# Patient Record
Sex: Female | Born: 1985 | Race: Black or African American | Hispanic: Yes | Marital: Single | Smoking: Current every day smoker
Health system: Southern US, Community
[De-identification: ages and names within clinical notes are randomized; demographics above are authoritative.]

## PROBLEM LIST (undated history)

## (undated) DIAGNOSIS — N809 Endometriosis, unspecified: Secondary | ICD-10-CM

## (undated) HISTORY — PX: OTHER SURGICAL HISTORY: SHX169

---

## 2007-09-04 ENCOUNTER — Emergency Department (HOSPITAL_COMMUNITY): Admission: EM | Admit: 2007-09-04 | Discharge: 2007-09-04 | Payer: Self-pay | Admitting: Emergency Medicine

## 2007-09-05 ENCOUNTER — Emergency Department (HOSPITAL_COMMUNITY): Admission: EM | Admit: 2007-09-05 | Discharge: 2007-09-05 | Payer: Self-pay | Admitting: Emergency Medicine

## 2009-09-01 ENCOUNTER — Emergency Department (HOSPITAL_COMMUNITY): Admission: EM | Admit: 2009-09-01 | Discharge: 2009-09-01 | Payer: Self-pay | Admitting: Emergency Medicine

## 2010-02-09 ENCOUNTER — Emergency Department (HOSPITAL_COMMUNITY): Admission: EM | Admit: 2010-02-09 | Discharge: 2010-02-09 | Payer: Self-pay | Admitting: Emergency Medicine

## 2010-07-28 LAB — WET PREP, GENITAL: Yeast Wet Prep HPF POC: NONE SEEN

## 2010-07-28 LAB — POCT URINALYSIS DIPSTICK
Ketones, ur: 15 mg/dL — AB
Specific Gravity, Urine: 1.025 (ref 1.005–1.030)
pH: 6 (ref 5.0–8.0)

## 2010-07-28 LAB — GC/CHLAMYDIA PROBE AMP, GENITAL: GC Probe Amp, Genital: NEGATIVE

## 2011-02-07 LAB — GC/CHLAMYDIA PROBE AMP, GENITAL: GC Probe Amp, Genital: NEGATIVE

## 2011-02-07 LAB — PREGNANCY, URINE: Preg Test, Ur: NEGATIVE

## 2011-02-07 LAB — URINALYSIS, ROUTINE W REFLEX MICROSCOPIC
Ketones, ur: NEGATIVE
Nitrite: NEGATIVE
Protein, ur: NEGATIVE
pH: 7.5

## 2011-02-07 LAB — RPR: RPR Ser Ql: NONREACTIVE

## 2011-02-07 LAB — URINE MICROSCOPIC-ADD ON

## 2011-02-07 LAB — WET PREP, GENITAL: WBC, Wet Prep HPF POC: NONE SEEN

## 2014-08-14 ENCOUNTER — Emergency Department (HOSPITAL_COMMUNITY)
Admission: EM | Admit: 2014-08-14 | Discharge: 2014-08-14 | Disposition: A | Discharge status: Home / Self Care | Payer: BLUE CROSS/BLUE SHIELD | Attending: Emergency Medicine | Admitting: Emergency Medicine

## 2014-08-14 ENCOUNTER — Encounter (HOSPITAL_COMMUNITY): Payer: Self-pay

## 2014-08-14 ENCOUNTER — Emergency Department (HOSPITAL_COMMUNITY): Payer: BLUE CROSS/BLUE SHIELD

## 2014-08-14 DIAGNOSIS — R102 Pelvic and perineal pain: Secondary | ICD-10-CM

## 2014-08-14 DIAGNOSIS — Z3202 Encounter for pregnancy test, result negative: Secondary | ICD-10-CM | POA: Insufficient documentation

## 2014-08-14 DIAGNOSIS — N83201 Unspecified ovarian cyst, right side: Secondary | ICD-10-CM

## 2014-08-14 DIAGNOSIS — Z720 Tobacco use: Secondary | ICD-10-CM | POA: Diagnosis not present

## 2014-08-14 DIAGNOSIS — N8320 Unspecified ovarian cysts: Secondary | ICD-10-CM | POA: Diagnosis not present

## 2014-08-14 DIAGNOSIS — R1030 Lower abdominal pain, unspecified: Secondary | ICD-10-CM | POA: Diagnosis present

## 2014-08-14 LAB — CBC WITH DIFFERENTIAL/PLATELET
BASOS PCT: 0 % (ref 0–1)
Basophils Absolute: 0.1 10*3/uL (ref 0.0–0.1)
Eosinophils Absolute: 0.2 10*3/uL (ref 0.0–0.7)
Eosinophils Relative: 2 % (ref 0–5)
HCT: 41.1 % (ref 36.0–46.0)
HEMOGLOBIN: 13.6 g/dL (ref 12.0–15.0)
Lymphocytes Relative: 19 % (ref 12–46)
Lymphs Abs: 2.6 10*3/uL (ref 0.7–4.0)
MCH: 31.9 pg (ref 26.0–34.0)
MCHC: 33.1 g/dL (ref 30.0–36.0)
MCV: 96.5 fL (ref 78.0–100.0)
MONOS PCT: 6 % (ref 3–12)
Monocytes Absolute: 0.9 10*3/uL (ref 0.1–1.0)
NEUTROS ABS: 9.9 10*3/uL — AB (ref 1.7–7.7)
NEUTROS PCT: 73 % (ref 43–77)
Platelets: 283 10*3/uL (ref 150–400)
RBC: 4.26 MIL/uL (ref 3.87–5.11)
RDW: 13.4 % (ref 11.5–15.5)
WBC: 13.6 10*3/uL — AB (ref 4.0–10.5)

## 2014-08-14 LAB — COMPREHENSIVE METABOLIC PANEL
ALT: 13 U/L (ref 0–35)
AST: 21 U/L (ref 0–37)
Albumin: 3.7 g/dL (ref 3.5–5.2)
Alkaline Phosphatase: 78 U/L (ref 39–117)
Anion gap: 6 (ref 5–15)
BILIRUBIN TOTAL: 0.9 mg/dL (ref 0.3–1.2)
BUN: 9 mg/dL (ref 6–23)
CALCIUM: 9 mg/dL (ref 8.4–10.5)
CHLORIDE: 107 mmol/L (ref 96–112)
CO2: 24 mmol/L (ref 19–32)
Creatinine, Ser: 0.69 mg/dL (ref 0.50–1.10)
GFR calc non Af Amer: >90 mL/min (ref >90)
GLUCOSE: 83 mg/dL (ref 70–99)
Potassium: 4.9 mmol/L (ref 3.5–5.1)
SODIUM: 137 mmol/L (ref 135–145)
Total Protein: 6.6 g/dL (ref 6.0–8.3)

## 2014-08-14 LAB — URINALYSIS, ROUTINE W REFLEX MICROSCOPIC
Bilirubin Urine: NEGATIVE
Glucose, UA: NEGATIVE mg/dL
Hgb urine dipstick: NEGATIVE
KETONES UR: NEGATIVE mg/dL
LEUKOCYTES UA: NEGATIVE
NITRITE: NEGATIVE
PROTEIN: NEGATIVE mg/dL
Specific Gravity, Urine: 1.013 (ref 1.005–1.030)
UROBILINOGEN UA: 0.2 mg/dL (ref 0.0–1.0)
pH: 6 (ref 5.0–8.0)

## 2014-08-14 LAB — POC URINE PREG, ED: PREG TEST UR: NEGATIVE

## 2014-08-14 LAB — WET PREP, GENITAL
CLUE CELLS WET PREP: NONE SEEN
TRICH WET PREP: NONE SEEN
YEAST WET PREP: NONE SEEN

## 2014-08-14 MED ORDER — OXYCODONE-ACETAMINOPHEN 5-325 MG PO TABS: 1.0000 | ORAL_TABLET | Freq: Four times a day (QID) | ORAL | Status: DC | PRN

## 2014-08-14 MED ORDER — MORPHINE SULFATE 4 MG/ML IJ SOLN
4.0000 mg | Freq: Once | INTRAMUSCULAR | Status: AC
  Administered 2014-08-14: 4 mg via INTRAVENOUS
  Filled 2014-08-14: qty 1

## 2014-08-14 MED ORDER — ONDANSETRON HCL 4 MG/2ML IJ SOLN
4.0000 mg | Freq: Once | INTRAMUSCULAR | Status: AC
  Administered 2014-08-14: 4 mg via INTRAVENOUS
  Filled 2014-08-14: qty 2

## 2014-08-14 NOTE — ED Provider Notes (Signed)
CSN: [id number]     Arrival date & time 08/14/14  4098 History   First MD Initiated Contact with Patient 08/14/14 [phone]     Chief Complaint  Patient presents with  . Abdominal Pain     (Consider location/radiation/quality/duration/timing/severity/associated sxs/prior Treatment) HPI Comments: Patient presents today with a chief complaint of abdominal pain.  She reports that the pain is located across her lower abdomen and radiates to the flank area bilaterally.  She reports that the pain woke her up from sleep at 6:50 AM this morning.  Pain has been constant since that time, but is improving somewhat.  She states that the pain is worse when she sits up straight.  She states that she had a BM this morning, but thinks that this made the pain worse.  She reports increased urinary frequency, but denies dysuria or urgency.  She reports that she has had whitish colored vaginal discharge for the past couple of months.  She denies fever, chills, nausea, vomiting, or diarrhea.  LMP was around 06-28-14.    The history is provided by the patient.    History reviewed. No pertinent past medical history. History reviewed. No pertinent past surgical history. No family history on file. History  Substance Use Topics  . Smoking status: Current Every Day Smoker    Types: Cigarettes  . Smokeless tobacco: Not on file  . Alcohol Use: Yes     Comment: occassional   OB History    No data available     Review of Systems  All other systems reviewed and are negative.     Allergies  Review of patient's allergies indicates no known allergies.  Home Medications   Prior to Admission medications   Not on File   BP 116/71 mmHg  Pulse 101  Temp(Src) 98.9 F (37.2 C) (Oral)  Resp 18  Ht  (1.702 m)  Wt 200 lb (90.719 kg)  BMI 31.32 kg/m2  SpO2 96%  LMP 06/28/2014 Physical Exam  Constitutional: She appears well-developed and well-nourished.  HENT:  Head: Normocephalic and atraumatic.   Mouth/Throat: Oropharynx is clear and moist.  Neck: Normal range of motion. Neck supple.  Cardiovascular: Normal rate, regular rhythm and normal heart sounds.   Pulmonary/Chest: Effort normal and breath sounds normal.  Abdominal: Soft. Bowel sounds are normal. She exhibits no distension and no mass. There is tenderness in the right lower quadrant, suprapubic area and left lower quadrant. There is no rigidity, no rebound and no guarding.  Tenderness to palpation across lower abdomen  Genitourinary: Cervix exhibits no motion tenderness. Right adnexum displays tenderness. Right adnexum displays no mass and no fullness. Left adnexum displays tenderness. Left adnexum displays no mass and no fullness.  Musculoskeletal: Normal range of motion.  Neurological: She is alert.  Skin: Skin is warm and dry.  Psychiatric: She has a normal mood and affect.  Nursing note and vitals reviewed.   ED Course  Procedures (including critical care time) Labs Review Labs Reviewed  WET PREP, GENITAL - Abnormal; Notable for the following:    WBC, Wet Prep HPF POC FEW (*)    All other components within normal limits  CBC WITH DIFFERENTIAL/PLATELET - Abnormal; Notable for the following:    WBC 13.6 (*)    Neutro Abs 9.9 (*)    All other components within normal limits  URINALYSIS, ROUTINE W REFLEX MICROSCOPIC  COMPREHENSIVE METABOLIC PANEL  POC URINE PREG, ED  GC/CHLAMYDIA PROBE AMP ([place])    Imaging Review US  Transvaginal Non-ob  08/14/2014   CLINICAL DATA:  Pelvic pain  EXAM: TRANSABDOMINAL AND TRANSVAGINAL ULTRASOUND OF PELVIS  DOPPLER ULTRASOUND OF OVARIES  TECHNIQUE: Both transabdominal and transvaginal ultrasound examinations of the pelvis were performed. Transabdominal technique was performed for global imaging of the pelvis including uterus, ovaries, adnexal regions, and pelvic cul-de-sac.  It was necessary to proceed with endovaginal exam following the transabdominal exam to visualize the  ovaries. Color and duplex Doppler ultrasound was utilized to evaluate blood flow to the ovaries.  COMPARISON:  None.  FINDINGS: Uterus  Measurements: 7.0 x 3.8 x 3.8 cm. A fibroid is noted within the uterine fundus measuring 1.6 cm.  Endometrium  Thickness: 8 mm.  No focal abnormality visualized.  Right ovary  Measurements: 5.6 x 3.4 x 3.7 cm. Multiple cysts are noted within the right ovary. The largest of these measures 3.1 cm in greatest dimension. Some slight increased echogenicity is noted within the cyst which may represent some hemorrhage. Normal arterial venous blood flow is noted within the right ovary.  Left ovary  Measurements: 4.4 x 3.1 x 2.5 cm. Normal appearance/no adnexal mass.  Pulsed Doppler evaluation of both ovaries demonstrates normal low-resistance arterial and venous waveforms.  Other findings  Minimal free fluid is noted.  IMPRESSION: No evidence of ovarian torsion  Ovarian cystic changes on the right. A dominant 3.1 cm cyst is noted with some internal echoes likely representing some hemorrhage.  Minimal free pelvic fluid, this may be physiologic.   Electronically Signed   By: Alcide Clever M.D.   On: 08/14/2014 13:07   US Pelvis Complete  08/14/2014   CLINICAL DATA:  Pelvic pain  EXAM: TRANSABDOMINAL AND TRANSVAGINAL ULTRASOUND OF PELVIS  DOPPLER ULTRASOUND OF OVARIES  TECHNIQUE: Both transabdominal and transvaginal ultrasound examinations of the pelvis were performed. Transabdominal technique was performed for global imaging of the pelvis including uterus, ovaries, adnexal regions, and pelvic cul-de-sac.  It was necessary to proceed with endovaginal exam following the transabdominal exam to visualize the ovaries. Color and duplex Doppler ultrasound was utilized to evaluate blood flow to the ovaries.  COMPARISON:  None.  FINDINGS: Uterus  Measurements: 7.0 x 3.8 x 3.8 cm. A fibroid is noted within the uterine fundus measuring 1.6 cm.  Endometrium  Thickness: 8 mm.  No focal abnormality  visualized.  Right ovary  Measurements: 5.6 x 3.4 x 3.7 cm. Multiple cysts are noted within the right ovary. The largest of these measures 3.1 cm in greatest dimension. Some slight increased echogenicity is noted within the cyst which may represent some hemorrhage. Normal arterial venous blood flow is noted within the right ovary.  Left ovary  Measurements: 4.4 x 3.1 x 2.5 cm. Normal appearance/no adnexal mass.  Pulsed Doppler evaluation of both ovaries demonstrates normal low-resistance arterial and venous waveforms.  Other findings  Minimal free fluid is noted.  IMPRESSION: No evidence of ovarian torsion  Ovarian cystic changes on the right. A dominant 3.1 cm cyst is noted with some internal echoes likely representing some hemorrhage.  Minimal free pelvic fluid, this may be physiologic.   Electronically Signed   By: Alcide Clever M.D.   On: 08/14/2014 13:07   [place] Art/ven Flow Abd Pelv Doppler  08/14/2014   CLINICAL DATA:  Pelvic pain  EXAM: TRANSABDOMINAL AND TRANSVAGINAL ULTRASOUND OF PELVIS  DOPPLER ULTRASOUND OF OVARIES  TECHNIQUE: Both transabdominal and transvaginal ultrasound examinations of the pelvis were performed. Transabdominal technique was performed for global imaging of the pelvis including uterus, ovaries, adnexal  regions, and pelvic cul-de-sac.  It was necessary to proceed with endovaginal exam following the transabdominal exam to visualize the ovaries. Color and duplex Doppler ultrasound was utilized to evaluate blood flow to the ovaries.  COMPARISON:  None.  FINDINGS: Uterus  Measurements: 7.0 x 3.8 x 3.8 cm. A fibroid is noted within the uterine fundus measuring 1.6 cm.  Endometrium  Thickness: 8 mm.  No focal abnormality visualized.  Right ovary  Measurements: 5.6 x 3.4 x 3.7 cm. Multiple cysts are noted within the right ovary. The largest of these measures 3.1 cm in greatest dimension. Some slight increased echogenicity is noted within the cyst which may represent some hemorrhage. Normal  arterial venous blood flow is noted within the right ovary.  Left ovary  Measurements: 4.4 x 3.1 x 2.5 cm. Normal appearance/no adnexal mass.  Pulsed Doppler evaluation of both ovaries demonstrates normal low-resistance arterial and venous waveforms.  Other findings  Minimal free fluid is noted.  IMPRESSION: No evidence of ovarian torsion  Ovarian cystic changes on the right. A dominant 3.1 cm cyst is noted with some internal echoes likely representing some hemorrhage.  Minimal free pelvic fluid, this may be physiologic.   Electronically Signed   By: Alcide CleverMark  Lukens M.D.   On: 08/14/2014 13:07     EKG Interpretation None     1:30 PM Reassessed patient.  Abdomen is soft with mild tenderness to palpation across lower abdomen.  No rebound or guarding.   MDM   Final diagnoses:  None   Patient presents today with complaints of pain across her lower abdomen.  Urine pregnancy negative.  UA also negative.  She did have some adnexal tenderness bilaterally with pelvic exam, but no abnormal discharge or CMT.  Pelvic ultrasound showing no evidence of torsion, but ovarian cystic changes on the right. No rebound or guarding on abdominal exam.  Patient afebrile.  Pain is controlled at time of discharge.  Feel that the patient is stable for discharge.  Patient instructed to follow up with Dr. Arlyce DiceKaplan her OB/GYN.  Return precautions given.    Santiago GladHeather Valissa Lyvers, PA-C 08/16/14 [id number]  Cathren LaineKevin Steinl, MD 08/17/14 [phone]

## 2014-08-14 NOTE — ED Notes (Signed)
Patient transported to Ultrasound 

## 2014-08-14 NOTE — ED Notes (Addendum)
Woke up with lower abdominal pain into her bilateral flank area.  Pt. Had difficulty moving bowels this am.  Pt. Denies any dsyuria.  Pt. Having vaginal discharge, with an odor.  Pt. Denies any n/v/d

## 2014-08-17 LAB — GC/CHLAMYDIA PROBE AMP (~~LOC~~) NOT AT ARMC
Chlamydia: NEGATIVE
NEISSERIA GONORRHEA: NEGATIVE

## 2018-11-06 ENCOUNTER — Other Ambulatory Visit: Payer: Self-pay

## 2018-11-06 ENCOUNTER — Emergency Department (HOSPITAL_COMMUNITY)
Admission: EM | Admit: 2018-11-06 | Discharge: 2018-11-06 | Disposition: A | Discharge status: Home / Self Care | Payer: BLUE CROSS/BLUE SHIELD | Attending: Emergency Medicine | Admitting: Emergency Medicine

## 2018-11-06 ENCOUNTER — Encounter (HOSPITAL_COMMUNITY): Payer: Self-pay

## 2018-11-06 DIAGNOSIS — F17210 Nicotine dependence, cigarettes, uncomplicated: Secondary | ICD-10-CM | POA: Insufficient documentation

## 2018-11-06 DIAGNOSIS — R112 Nausea with vomiting, unspecified: Secondary | ICD-10-CM | POA: Insufficient documentation

## 2018-11-06 DIAGNOSIS — N939 Abnormal uterine and vaginal bleeding, unspecified: Secondary | ICD-10-CM | POA: Insufficient documentation

## 2018-11-06 DIAGNOSIS — R197 Diarrhea, unspecified: Secondary | ICD-10-CM | POA: Insufficient documentation

## 2018-11-06 DIAGNOSIS — R252 Cramp and spasm: Secondary | ICD-10-CM | POA: Insufficient documentation

## 2018-11-06 HISTORY — DX: Endometriosis, unspecified: N80.9

## 2018-11-06 LAB — I-STAT BETA HCG BLOOD, ED (MC, WL, AP ONLY): I-stat hCG, quantitative: <5 m[IU]/mL (ref <5)

## 2018-11-06 LAB — COMPREHENSIVE METABOLIC PANEL
ALT: 15 U/L (ref 0–44)
AST: 14 U/L — ABNORMAL LOW (ref 15–41)
Albumin: 4.4 g/dL (ref 3.5–5.0)
Alkaline Phosphatase: 83 U/L (ref 38–126)
Anion gap: 8 (ref 5–15)
BUN: 6 mg/dL (ref 6–20)
CO2: 26 mmol/L (ref 22–32)
Calcium: 8.9 mg/dL (ref 8.9–10.3)
Chloride: 104 mmol/L (ref 98–111)
Creatinine, Ser: 0.67 mg/dL (ref 0.44–1.00)
GFR calc Af Amer: >60 mL/min (ref >60)
GFR calc non Af Amer: >60 mL/min (ref >60)
Glucose, Bld: 91 mg/dL (ref 70–99)
Potassium: 3.5 mmol/L (ref 3.5–5.1)
Sodium: 138 mmol/L (ref 135–145)
Total Bilirubin: 0.5 mg/dL (ref 0.3–1.2)
Total Protein: 7.5 g/dL (ref 6.5–8.1)

## 2018-11-06 LAB — URINALYSIS, ROUTINE W REFLEX MICROSCOPIC
Bilirubin Urine: NEGATIVE
Glucose, UA: NEGATIVE mg/dL
Hgb urine dipstick: NEGATIVE
Ketones, ur: NEGATIVE mg/dL
Leukocytes,Ua: NEGATIVE
Nitrite: NEGATIVE
Protein, ur: NEGATIVE mg/dL
Specific Gravity, Urine: 1.008 (ref 1.005–1.030)
pH: 7 (ref 5.0–8.0)

## 2018-11-06 LAB — CBC
HCT: 42.9 % (ref 36.0–46.0)
Hemoglobin: 13.5 g/dL (ref 12.0–15.0)
MCH: 31 pg (ref 26.0–34.0)
MCHC: 31.5 g/dL (ref 30.0–36.0)
MCV: 98.6 fL (ref 80.0–100.0)
Platelets: 302 10*3/uL (ref 150–400)
RBC: 4.35 MIL/uL (ref 3.87–5.11)
RDW: 12.8 % (ref 11.5–15.5)
WBC: 11.9 10*3/uL — ABNORMAL HIGH (ref 4.0–10.5)
nRBC: 0 % (ref 0.0–0.2)

## 2018-11-06 LAB — LIPASE, BLOOD: Lipase: 38 U/L (ref 11–51)

## 2018-11-06 MED ORDER — SODIUM CHLORIDE 0.9% FLUSH
3.0000 mL | Freq: Once | INTRAVENOUS | Status: AC
  Administered 2018-11-06: 3 mL via INTRAVENOUS

## 2018-11-06 MED ORDER — ONDANSETRON HCL 8 MG PO TABS: 8.0000 mg | ORAL_TABLET | Freq: Three times a day (TID) | ORAL | 0 refills | Status: DC | PRN

## 2018-11-06 MED ORDER — ONDANSETRON 4 MG PO TBDP
4.0000 mg | ORAL_TABLET | Freq: Once | ORAL | Status: AC | PRN
  Administered 2018-11-06: 4 mg via ORAL
  Filled 2018-11-06: qty 1

## 2018-11-06 MED ORDER — SODIUM CHLORIDE 0.9 % IV BOLUS
1000.0000 mL | Freq: Once | INTRAVENOUS | Status: AC
  Administered 2018-11-06: 1000 mL via INTRAVENOUS

## 2018-11-06 NOTE — ED Provider Notes (Signed)
[facility]-EMERGENCY DEPT Provider Note   CSN: [id number] Arrival date & time: 11/06/18  1225    History   Chief Complaint Chief Complaint  Patient presents with  . Abdominal Pain  . Emesis    HPI Christie Gonzalez is a 33 y.o. female.     HPI   She presents for evaluation of diffuse abdominal pain with vomiting and diarrhea, present for 2 weeks.  She started her menstrual cycle yesterday.  She is complaining of pelvic cramps consistent with her usual menses.  She denied blood in emesis.  She denies fever, chills, shortness of breath, weakness or dizziness.  She has had occasional cough, but she relates to her smoking tobacco.  No known sick contacts.  She currently employed, helping people find jobs.  There are no other known modifying factors.  Past Medical History:  Diagnosis Date  . Endometriosis     There are no active problems to display for this patient.   Past Surgical History:  Procedure Laterality Date  . vaginal ablation       OB History   No obstetric history on file.      Home Medications    Prior to Admission medications   Medication Sig Start Date End Date Taking? Authorizing Provider  traMADol (ULTRAM) 50 MG tablet Take 50-100 mg by mouth 4 (four) times daily as needed for pain. 10/21/18 11/20/18 Yes [provider]  ondansetron (ZOFRAN) 8 MG tablet Take 1 tablet (8 mg total) by mouth every 8 (eight) hours as needed for nausea or vomiting. 11/06/18   Mancel BaleWentz, Darnice Comrie, MD  oxyCODONE-acetaminophen (PERCOCET/ROXICET) 5-325 MG per tablet Take 1-2 tablets by mouth every 6 (six) hours as needed for severe pain. Patient not taking: Reported on 11/06/2018 08/14/14   Santiago GladLaisure, Heather, PA-C    Family History History reviewed. No pertinent family history.  Social History Social History   Tobacco Use  . Smoking status: Current Every Day Smoker    Packs/day: 0.50    Types: Cigarettes  . Smokeless tobacco: Never Used  Substance Use  Topics  . Alcohol use: Yes    Comment: occassional  . Drug use: No     Allergies   Patient has no known allergies.   Review of Systems Review of Systems  All other systems reviewed and are negative.    Physical Exam Updated Vital Signs BP (!) 149/67   Pulse 69   Temp 98.8 F (37.1 C) (Oral)   Resp 17   Ht 5\' 7"  (1.702 m)   Wt 99.8 kg   LMP 11/05/2018   SpO2 100%   BMI 34.46 kg/m   Physical Exam Vitals signs and nursing note reviewed.  Constitutional:      General: She is not in acute distress.    Appearance: She is well-developed. She is obese. She is not ill-appearing, toxic-appearing or diaphoretic.  HENT:     Head: Normocephalic and atraumatic.     Right Ear: External ear normal.     Left Ear: External ear normal.  Eyes:     Conjunctiva/sclera: Conjunctivae normal.     Pupils: Pupils are equal, round, and reactive to light.  Neck:     Musculoskeletal: Normal range of motion and neck supple.     Trachea: Phonation normal.  Cardiovascular:     Rate and Rhythm: Normal rate and regular rhythm.     Heart sounds: Normal heart sounds.  Pulmonary:     Effort: Pulmonary effort is normal.  Breath sounds: Normal breath sounds.  Abdominal:     General: There is no distension.     Palpations: Abdomen is soft. There is no mass.     Tenderness: There is abdominal tenderness (Diffuse, mild). There is no guarding.     Hernia: No hernia is present.  Musculoskeletal: Normal range of motion.  Skin:    General: Skin is warm and dry.  Neurological:     Mental Status: She is alert and oriented to person, place, and time.     Cranial Nerves: No cranial nerve deficit.     Sensory: No sensory deficit.     Motor: No abnormal muscle tone.     Coordination: Coordination normal.  Psychiatric:        Mood and Affect: Mood normal.        Behavior: Behavior normal.        Thought Content: Thought content normal.        Judgment: Judgment normal.      ED Treatments /  Results  Labs (all labs ordered are listed, but only abnormal results are displayed) Labs Reviewed  COMPREHENSIVE METABOLIC PANEL - Abnormal; Notable for the following components:      Result Value   AST 14 (*)    All other components within normal limits  CBC - Abnormal; Notable for the following components:   WBC 11.9 (*)    All other components within normal limits  LIPASE, BLOOD  URINALYSIS, ROUTINE W REFLEX MICROSCOPIC  I-STAT BETA HCG BLOOD, ED ([facility], [facility], [facility] ONLY)    EKG None  Radiology No results found.  Procedures Procedures (including critical care time)  Medications Ordered in ED Medications  sodium chloride flush (NS) 0.9 % injection 3 mL (3 mLs Intravenous Given 11/06/18 1650)  ondansetron (ZOFRAN-ODT) disintegrating tablet 4 mg (4 mg Oral Given 11/06/18 1302)  sodium chloride 0.9 % bolus 1,000 mL (1,000 mLs Intravenous New Bag/Given 11/06/18 1650)     Initial Impression / Assessment and Plan / ED Course  I have reviewed the triage vital signs and the nursing notes.  Pertinent labs & imaging results that were available during my care of the patient were reviewed by me and considered in my medical decision making (see chart for details).  Clinical Course as of Nov 05 1925  Wed Nov 06, 2018  1913 Normal  Urinalysis, Routine w reflex microscopic [EW]  1913 Normal  Lipase, blood [EW]  1914 Normal except white count slightly elevated  CBC(!) [EW]  1914 Normal, not pregnant  I-Stat beta hCG blood, ED [EW]  1914 Normal  Comprehensive metabolic panel(!) [EW]    Clinical Course User Index [EW] Daleen Bo, MD        Patient Vitals for the past 24 hrs:  BP Temp Temp src Pulse Resp SpO2 Height Weight  11/06/18 1830 - - - 69 - 100 % - -  11/06/18 1809 (!) 149/67 - - 69 - 100 % - -  11/06/18 1808 (!) 149/67 - - 82 17 99 % - -  11/06/18 1253 - - - - - - 5\' 7"  (1.702 m) 99.8 kg  11/06/18 1248 (!) 148/85 98.8 F (37.1 C) Oral 76 16 100 % - -    7:14 PM  Reevaluation with update and discussion. After initial assessment and treatment, an updated evaluation reveals she states that she is ready to go.  She is somewhat tearful talking about her ongoing symptoms and states she is worried about "cancer."  I  attempted to reassure her that the tests were normal today, and advised her that further evaluation for this problem and additional testing, can be done by her gastroenterologist.  She was happy with that and all of her questions were answered.Mancel Bale. Corrisa Gibby   Medical Decision Making: Abdominal pain with nonspecific nausea, vomiting and diarrhea.  Patient is symptomatically better after receiving IV fluids.  I doubt serious bacterial infection, metabolic instability or impending vascular collapse.  No good evidence for cancer including weight loss or blood in body fluids.  CRITICAL CARE-no Performed by: Mancel BaleElliott Chizuko Trine  Nursing Notes Reviewed/ Care Coordinated Applicable Imaging Reviewed Interpretation of Laboratory Data incorporated into ED treatment  The patient appears reasonably screened and/or stabilized for discharge and I doubt any other medical condition or other [facility] requiring further screening, evaluation, or treatment in the ED at this time prior to discharge.  Plan: Home Medications-continue usual medicines; Home Treatments-gradually [employer] diet; return here if the recommended treatment, does not improve the symptoms; Recommended follow up-PCP, PRN.  Follow-up with GI, for further testing and treatment as needed.   Final Clinical Impressions(s) / ED Diagnoses   Final diagnoses:  Nausea vomiting and diarrhea    ED Discharge Orders         Ordered    ondansetron (ZOFRAN) 8 MG tablet  Every 8 hours PRN     11/06/18 1924           Mancel BaleWentz, Quamesha Mullet, MD 11/06/18 1927

## 2018-11-06 NOTE — ED Triage Notes (Signed)
Patient states she had vomiting and abdominal pain, sinus congestion, chills.and SOB. Today, the abdominal pain, N/V is worse.  Patient states she had a negative Corona virus test 2 weeks ago, but has been exposed to coworkers that were positive.

## 2018-11-06 NOTE — Discharge Instructions (Addendum)
The testing today is reassuring.  There is no signs of serious dehydration, infections, or significant blood loss.  We can never completely diagnose problems like this here in the emergency department.  Therefore since you are having ongoing symptoms, it is a good idea to follow-up with the gastroenterology physician service, listed on this document.  Call them to schedule appointment as soon as possible.  In the meantime start with a clear liquid diet and gradually advance to bland foods over a few days time.  We sent a prescription for nausea medicine to your pharmacy.  If you need something for diarrhea you can use Kaopectate, or Imodium.

## 2018-11-27 ENCOUNTER — Encounter: Payer: Self-pay | Admitting: Gastroenterology

## 2018-11-27 ENCOUNTER — Ambulatory Visit (INDEPENDENT_AMBULATORY_CARE_PROVIDER_SITE_OTHER): Payer: Self-pay | Admitting: Gastroenterology

## 2018-11-27 VITALS — Ht 67.0 in | Wt 215.0 lb

## 2018-11-27 DIAGNOSIS — R112 Nausea with vomiting, unspecified: Secondary | ICD-10-CM

## 2018-11-27 DIAGNOSIS — R197 Diarrhea, unspecified: Secondary | ICD-10-CM

## 2018-11-27 MED ORDER — ONDANSETRON 4 MG PO TBDP: 4.0000 mg | ORAL_TABLET | Freq: Two times a day (BID) | ORAL | 3 refills | Status: AC | PRN

## 2018-11-27 NOTE — Progress Notes (Signed)
This service was provided via virtual visit.  We tried audio and visual however she was not able to connect and so we went with phone only the patient was located at home.  I was located in my office.  The patient did consent to this virtual visit and is aware of possible charges through their insurance for this visit.  The patient is a new patient.  My certified medical assistant, Grace Bushy, contributed to this visit by contacting the patient by phone 1 or 2 business days prior to the appointment and also followed up on the recommendations I made after the visit.  Time spent on virtual visit: 23 min  She was referred by the emergency room Dr. Eulis Foster   HPI: This is a pleasant 33 year old woman whom I am meeting today for the first time via this telemedicine visit.  She was a bit of a rambling historian but basically in the past month or so she has had significant nausea vomiting and a change in her bowels.  Sounds like she is vomiting not every day but most days.  She has nausea nearly constantly.  She has had diarrhea at first and this is subsided but she still has loose stools at least twice a day.  She has not seen bleeding from anywhere.  She does have significant abdominal pains as well and she describes epigastric location of the pains.  The pain is intermittent.  She was tearful during the telemedicine visit.  She seems frustrated because she has been to at least 2 or 3 outpatient visits elsewhere, and urgent clinic visit, and ER visit and she has never really had any real relief or answers to what is going on.  She takes tramadol but no NSAIDs.  She's been on this for about a year. She takes it every day, usually 3-6 per day.  She does not think it is playing a role because she's been on it for so long.  Overall her weight has dropped.  She is enrolled in a pain clinic for chronic pelvic pain.  H/o endometriosis and 'vaginal ablation' surgery  She was at the [facility] long emergency  room 3 weeks ago with nausea and vomiting and diarrhea..  Labs June 2020 show normal lipase, normal complete metabolic profile, normal CBC except slightly elevated white blood cell count.  Chief complaint is nausea vomiting, diarrhea  ROS: complete GI ROS as described in HPI, all other review negative.  Constitutional:  No unintentional weight loss   Past Medical History:  Diagnosis Date  . Endometriosis     Past Surgical History:  Procedure Laterality Date  . vaginal ablation      Current Outpatient Medications  Medication Sig Dispense Refill  . traMADol (ULTRAM) 50 MG tablet Take by mouth every 6 (six) hours as needed.     No current facility-administered medications for this visit.     Allergies as of 11/27/2018  . (No Known Allergies)    History reviewed. No pertinent family history.  Social History   Socioeconomic History  . Marital status: Single    Spouse name: Not on file  . Number of children: Not on file  . Years of education: Not on file  . Highest education level: Not on file  Occupational History  . Not on file  Social Needs  . Financial resource strain: Not on file  . Food insecurity    Worry: Not on file    Inability: Not on file  . Transportation  needs    Medical: Not on file    Non-medical: Not on file  Tobacco Use  . Smoking status: Current Every Day Smoker    Packs/day: 0.50    Types: Cigarettes  . Smokeless tobacco: Never Used  Substance and Sexual Activity  . Alcohol use: Yes    Comment: occassional  . Drug use: No  . Sexual activity: Never    Birth control/protection: None  Lifestyle  . Physical activity    Days per week: Not on file    Minutes per session: Not on file  . Stress: Not on file  Relationships  . Social Musicianconnections    Talks on phone: Not on file    Gets together: Not on file    Attends religious service: Not on file    Active member of club or organization: Not on file    Attends meetings of clubs or  organizations: Not on file    Relationship status: Not on file  . Intimate partner violence    Fear of current or ex partner: Not on file    Emotionally abused: Not on file    Physically abused: Not on file    Forced sexual activity: Not on file  Other Topics Concern  . Not on file  Social History Narrative  . Not on file     Physical Exam: Unable to perform because this was a "telemed visit" due to current Covid-19 pandemic  Assessment and plan: 33 y.o. female with nausea, vomiting, diarrhea  Unclear etiology but certainly she needs some testing.  Recommended starting with repeat CBC, complete metabolic profile, sedimentation rate and also H. pylori serology.  Also stool testing for infection with a GI pathogen panel.  I am going to call her in a prescription for Zofran 4 mg pills, she she knows to take 1 pill twice daily.  As needed for pain.  She understands that if the testing does not explain why she is feeling so poorly that she probably will need further work-up.  Upper endoscopy, possibly imaging with a right upper quadrant ultrasound, possibly colonoscopy.  Please see the "Patient Instructions" section for addition details about the plan.  Rob Buntinganiel Kambree Krauss, MD  Gastroenterology 11/27/2018, 1:44 PM

## 2018-11-27 NOTE — Patient Instructions (Addendum)
We will call in a new prescription for her for Zofran 4 mg pills 1 pill twice daily as needed for nausea.  Dispense 60 with 3 refills.  She knows to come in for labs including a CBC, complete metabolic profile, sedimentation rate, H. pylori antigen.  Also stool testing, GI pathogen panel.  She knows that after reviewing the above test results she might need further testing including an upper endoscopy and possibly a colonoscopy.  Thank you for entrusting me with your care and choosing [facility].  Dr Ardis Hughs

## 2018-11-28 ENCOUNTER — Other Ambulatory Visit (INDEPENDENT_AMBULATORY_CARE_PROVIDER_SITE_OTHER): Payer: Self-pay

## 2018-11-28 DIAGNOSIS — R197 Diarrhea, unspecified: Secondary | ICD-10-CM

## 2018-11-28 DIAGNOSIS — R112 Nausea with vomiting, unspecified: Secondary | ICD-10-CM

## 2018-11-28 LAB — CBC WITH DIFFERENTIAL/PLATELET
Basophils Absolute: 0.1 10*3/uL (ref 0.0–0.1)
Basophils Relative: 1 % (ref 0.0–3.0)
Eosinophils Absolute: 0.4 10*3/uL (ref 0.0–0.7)
Eosinophils Relative: 4.4 % (ref 0.0–5.0)
HCT: 42.8 % (ref 36.0–46.0)
Hemoglobin: 14 g/dL (ref 12.0–15.0)
Lymphocytes Relative: 38.1 % (ref 12.0–46.0)
Lymphs Abs: 3.2 10*3/uL (ref 0.7–4.0)
MCHC: 32.7 g/dL (ref 30.0–36.0)
MCV: 95.4 fl (ref 78.0–100.0)
Monocytes Absolute: 0.3 10*3/uL (ref 0.1–1.0)
Monocytes Relative: 3.8 % (ref 3.0–12.0)
Neutro Abs: 4.4 10*3/uL (ref 1.4–7.7)
Neutrophils Relative %: 52.7 % (ref 43.0–77.0)
Platelets: 283 10*3/uL (ref 150.0–400.0)
RBC: 4.49 Mil/uL (ref 3.87–5.11)
RDW: 13.8 % (ref 11.5–15.5)
WBC: 8.4 10*3/uL (ref 4.0–10.5)

## 2018-11-28 LAB — SEDIMENTATION RATE: Sed Rate: 13 mm/hr (ref 0–20)

## 2018-11-29 LAB — COMPREHENSIVE METABOLIC PANEL
ALT: 16 U/L (ref 0–35)
AST: 12 U/L (ref 0–37)
Albumin: 4.1 g/dL (ref 3.5–5.2)
Alkaline Phosphatase: 72 U/L (ref 39–117)
BUN: 6 mg/dL (ref 6–23)
CO2: 25 mEq/L (ref 19–32)
Calcium: 8.6 mg/dL (ref 8.4–10.5)
Chloride: 106 mEq/L (ref 96–112)
Creatinine, Ser: 0.79 mg/dL (ref 0.40–1.20)
GFR: 101.6 mL/min (ref >60.00)
Glucose, Bld: 122 mg/dL — ABNORMAL HIGH (ref 70–99)
Potassium: 4 mEq/L (ref 3.5–5.1)
Sodium: 138 mEq/L (ref 135–145)
Total Bilirubin: 0.4 mg/dL (ref 0.2–1.2)
Total Protein: 6.6 g/dL (ref 6.0–8.3)

## 2018-12-02 ENCOUNTER — Telehealth: Payer: Self-pay | Admitting: Gastroenterology

## 2018-12-02 LAB — GASTROINTESTINAL PATHOGEN PANEL PCR
C. difficile Tox A/B, PCR: NOT DETECTED
Campylobacter, PCR: NOT DETECTED
Cryptosporidium, PCR: NOT DETECTED
E coli (ETEC) LT/ST PCR: NOT DETECTED
E coli (STEC) stx1/stx2, PCR: NOT DETECTED
E coli 0157, PCR: NOT DETECTED
Giardia lamblia, PCR: NOT DETECTED
Norovirus, PCR: NOT DETECTED
Rotavirus A, PCR: NOT DETECTED
Salmonella, PCR: NOT DETECTED
Shigella, PCR: NOT DETECTED

## 2018-12-02 LAB — HELICOBACTER PYLORI  SPECIAL ANTIGEN
MICRO NUMBER:: 674271
RESULT:: DETECTED — AB
SPECIMEN QUALITY: ADEQUATE

## 2018-12-02 NOTE — Telephone Encounter (Signed)
The pt called looking for lab results.  GI path panel has not resulted.  She is aware and I will forward to Dr Ardis Hughs to review when he is able.  He is out of the office at this time.

## 2018-12-02 NOTE — Telephone Encounter (Signed)
Pt called inquiring about lab results. °

## 2018-12-04 ENCOUNTER — Telehealth: Payer: Self-pay | Admitting: Gastroenterology

## 2018-12-04 NOTE — Telephone Encounter (Signed)
I spoke with the pt and she was very tearful and upset because she has missed work so much in the past few weeks.  She wanted a letter stating that she needed to out of work for the past 2 weeks due to nausea and vomiting.  I advised her that Dr Ardis Hughs was out of town this week and I could send her a letter stating she was seen in the office on 7/15 but I could not giver her a letter to be out of work for 2 weeks.  She was very anxious and tearful and states that Dr Ardis Hughs should have told her he was going to be out of the office when her lab results were available.  She says that if she had known he was going to be out of the office she would have asked for the note on 7/15.  Dr Ardis Hughs the pt is very upset and would like her results and a letter to remain out of work and for the past 2 weeks until something is done for her.

## 2018-12-04 NOTE — Telephone Encounter (Signed)
Pt reported that she has been out of work for two weeks due to her symptoms: nausea and vomiting.  She requested a doctor's note faxed to [phone].  She called regarding her pathology results.

## 2018-12-05 ENCOUNTER — Other Ambulatory Visit: Payer: Self-pay

## 2018-12-05 MED ORDER — PYLERA 140-125-125 MG PO CAPS: 3.0000 | ORAL_CAPSULE | Freq: Three times a day (TID) | ORAL | 0 refills | Status: AC

## 2018-12-05 NOTE — Telephone Encounter (Signed)
The pt was advised and pylera sent to the pharmacy.  She was advised if the drug is too expensive to call back and we can send in the separate components.  The pt has been advised of the information and verbalized understanding.

## 2018-12-06 ENCOUNTER — Telehealth: Payer: Self-pay | Admitting: Gastroenterology

## 2018-12-06 NOTE — Telephone Encounter (Signed)
I spoke with the pt and she states that the pharmacy needed clarification on her pylera.  I called the pharmacy and was told they could not fill pylera for 14 days only 10.  They stated that they will call the pt and have the prescription transferred to a pharmacy that will fill for 14 day s

## 2018-12-06 NOTE — Telephone Encounter (Signed)
Pt reported that pharmacy did not fill Pylera and "needed clarification."

## 2018-12-09 ENCOUNTER — Telehealth: Payer: Self-pay | Admitting: Gastroenterology

## 2018-12-09 NOTE — Telephone Encounter (Signed)
Pt requested a call back "regarding paperwork"

## 2018-12-09 NOTE — Telephone Encounter (Signed)
Patient would like to know what is the next step for her and some recommendations.

## 2018-12-09 NOTE — Telephone Encounter (Signed)
Spoke to patient. Sent message to Dr Ardis Hughs

## 2018-12-16 ENCOUNTER — Telehealth: Payer: Self-pay | Admitting: Gastroenterology

## 2018-12-16 NOTE — Telephone Encounter (Signed)
Patient would like to [facility] regarding paper work. She would like a call back also because she want an appointment after she completed her medication which is this week.

## 2018-12-17 NOTE — Telephone Encounter (Signed)
Spoke to patient. She sent in FMLA  paper work last Friday once we receive will have Dr Ardis Hughs sign and send to [facility]. Follow up appointment has been made for next month.

## 2019-01-15 ENCOUNTER — Other Ambulatory Visit: Payer: Self-pay | Admitting: Gastroenterology

## 2019-01-15 DIAGNOSIS — R110 Nausea: Secondary | ICD-10-CM

## 2019-01-21 ENCOUNTER — Ambulatory Visit: Payer: Self-pay | Admitting: Gastroenterology

## 2019-01-23 ENCOUNTER — Other Ambulatory Visit: Payer: Self-pay

## 2019-01-23 ENCOUNTER — Ambulatory Visit (HOSPITAL_COMMUNITY)
Admission: RE | Admit: 2019-01-23 | Discharge: 2019-01-23 | Disposition: A | Discharge status: Home / Self Care | Payer: BC Managed Care – PPO | Source: Ambulatory Visit | Attending: Gastroenterology | Admitting: Gastroenterology

## 2019-01-23 ENCOUNTER — Encounter (HOSPITAL_COMMUNITY)
Admission: RE | Admit: 2019-01-23 | Discharge: 2019-01-23 | Disposition: A | Discharge status: Home / Self Care | Payer: BC Managed Care – PPO | Source: Ambulatory Visit | Attending: Gastroenterology | Admitting: Gastroenterology

## 2019-01-23 DIAGNOSIS — R110 Nausea: Secondary | ICD-10-CM

## 2019-01-23 MED ORDER — TECHNETIUM TC 99M MEBROFENIN IV KIT
5.1700 | PACK | Freq: Once | INTRAVENOUS | Status: AC | PRN
  Administered 2019-01-23: 5.17 via INTRAVENOUS

## 2019-01-24 ENCOUNTER — Other Ambulatory Visit (HOSPITAL_COMMUNITY): Payer: Self-pay | Admitting: Gastroenterology

## 2019-01-24 DIAGNOSIS — R110 Nausea: Secondary | ICD-10-CM

## 2019-01-24 DIAGNOSIS — R1033 Periumbilical pain: Secondary | ICD-10-CM

## 2019-01-28 ENCOUNTER — Ambulatory Visit (HOSPITAL_COMMUNITY)
Admission: RE | Admit: 2019-01-28 | Discharge: 2019-01-28 | Disposition: A | Discharge status: Home / Self Care | Payer: BC Managed Care – PPO | Source: Ambulatory Visit | Attending: Gastroenterology | Admitting: Gastroenterology

## 2019-01-28 ENCOUNTER — Other Ambulatory Visit: Payer: Self-pay

## 2019-01-28 DIAGNOSIS — R110 Nausea: Secondary | ICD-10-CM | POA: Diagnosis present

## 2019-01-28 DIAGNOSIS — R1033 Periumbilical pain: Secondary | ICD-10-CM | POA: Diagnosis present

## 2019-01-28 MED ORDER — IOHEXOL 300 MG/ML  SOLN
100.0000 mL | Freq: Once | INTRAMUSCULAR | Status: AC | PRN
  Administered 2019-01-28: 100 mL via INTRAVENOUS

## 2020-12-14 IMAGING — NM NM HEPATO W/GB/PHARM/[PERSON_NAME]
2 series · 12 of 12 positions shown · non-contrast
Comparison: None

CLINICAL DATA: RIGHT upper abdominal pain with nausea and vomiting
since October 2018, worsened pain after eating including fatty foods,
some LEFT side pain, history of endometriosis, H pylori

EXAM:
NUCLEAR MEDICINE HEPATOBILIARY IMAGING WITH GALLBLADDER EF
TECHNIQUE: Sequential images of the abdomen were obtained [DATE] minutes
following intravenous administration of radiopharmaceutical. After
oral ingestion of Ensure, gallbladder ejection fraction was
determined. At 60 min, normal ejection fraction is greater than 33%.
RADIOPHARMACEUTICALS:  5.17 mCi 1c-55m  Choletec IV

[he hepatobiliary · 4.52mm/px · 6 of 60 frames shown (1 of 2)]
[frame 6/60]
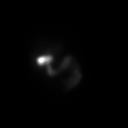
[frame 16/60]
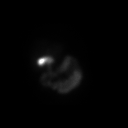
[frame 26/60]
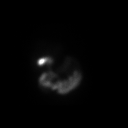
[frame 36/60]
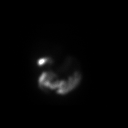
[frame 46/60]
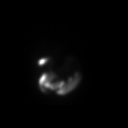
[frame 56/60]
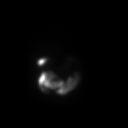

[he hepatobiliary · 4.52mm/px · 6 of 60 frames shown (2 of 2)]
[frame 6/60]
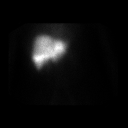
[frame 16/60]
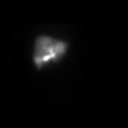
[frame 26/60]
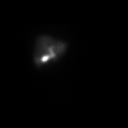
[frame 36/60]
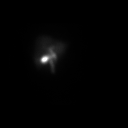
[frame 46/60]
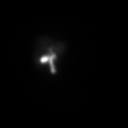
[frame 56/60]
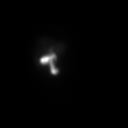

[12 of 12 positions shown; findings below may reference images not displayed]

FINDINGS: Normal tracer extraction from bloodstream indicating normal
hepatocellular function.

Normal excretion of tracer into biliary tree.

Gallbladder visualized at 14 min.

Small bowel visualized at 36 min.

No hepatic retention of tracer.

Subjectively normal emptying of tracer from gallbladder following
fatty meal stimulation.

Calculated gallbladder ejection fraction is 68%.

Patient reported upper abdominal pain and cramping following Ensure
ingestion.

Normal gallbladder ejection fraction following Ensure ingestion is
greater than 33% at 1 hour.
IMPRESSION: Patent biliary tree with normal gallbladder ejection fraction of 68%
following Ensure ingestion.

Patient reported upper abdominal pain and cramping after drinking
Ensure.

## 2020-12-19 IMAGING — CT CT ABDOMEN W/ CM
2 of 4 series · 16 of 46 positions shown, 18 images · IV contrast (Omni 300)
Comparison: Abdominal ultrasound of 01/23/2019

CLINICAL DATA: Appearance of extrinsic mass effect on the stomach
at endoscopy 2 weeks prior to imaging. Upper abdominal pain with
nausea.

EXAM:
CT ABDOMEN WITH CONTRAST
TECHNIQUE: Multidetector CT imaging of the abdomen was performed using the
standard protocol following bolus administration of intravenous
contrast.
CONTRAST:  100mL OMNIPAQUE IOHEXOL 300 MG/ML  SOLN

[Series 3: a/p w/ 5mm · axial · 0.70mm/px · z∈[+905,+1150]mm · 13 of 55 slices shown, 15 images]
[im 3/55  soft-tissue]
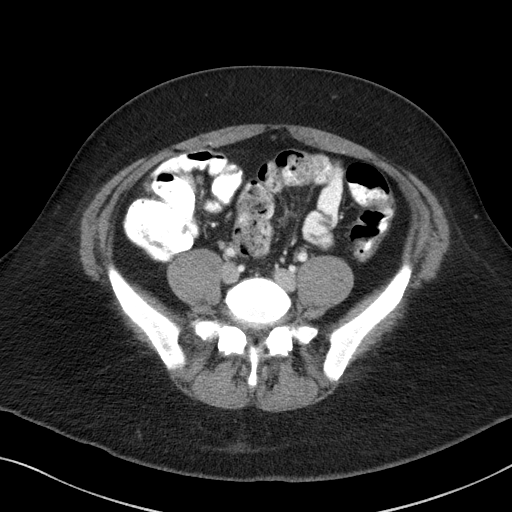
[im 3/55  bone]
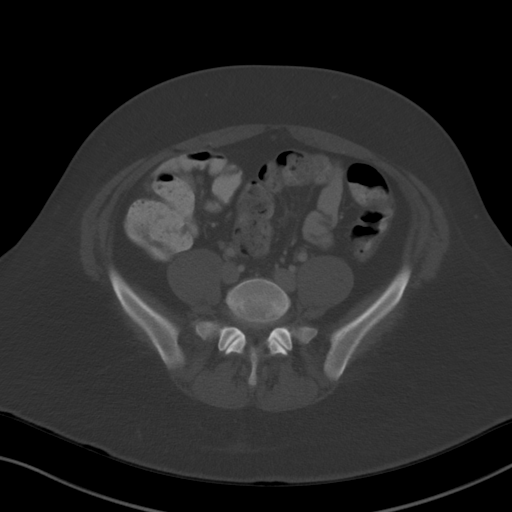
[im 8/55  soft-tissue]
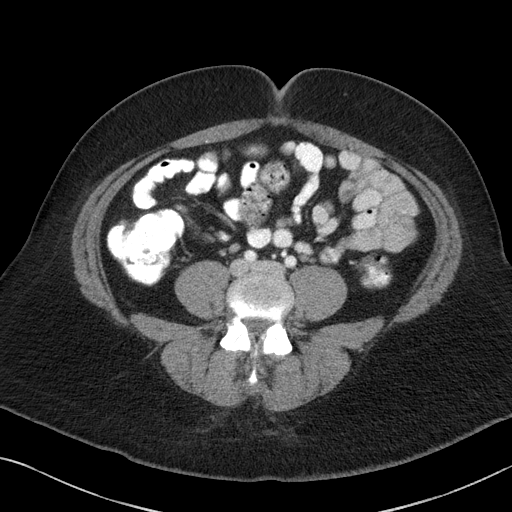
[im 11/55  soft-tissue]
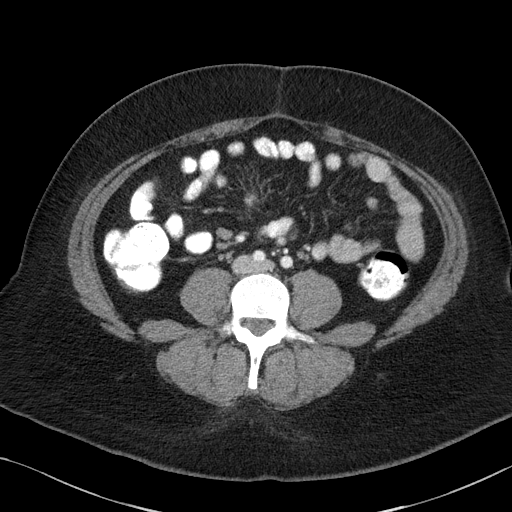
[im 16/55  soft-tissue]
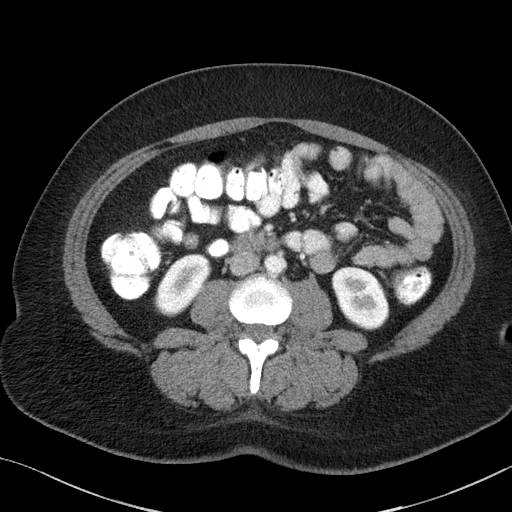
[im 19/55  soft-tissue]
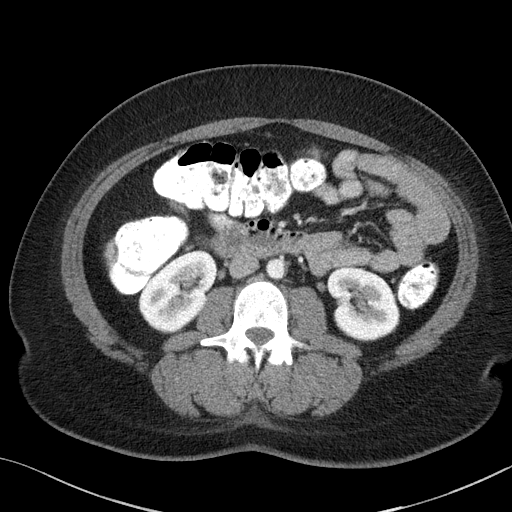
[im 24/55  soft-tissue]
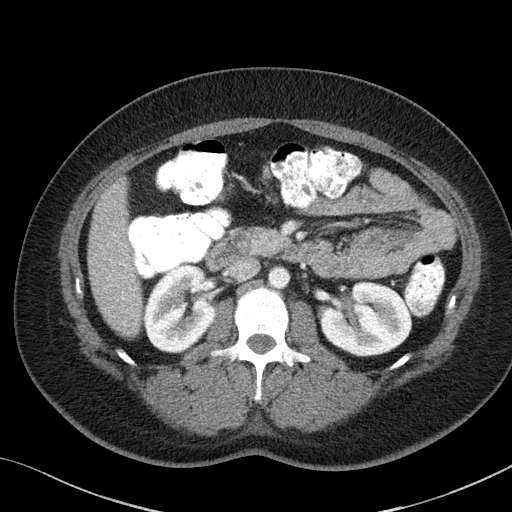
[im 29/55  soft-tissue]
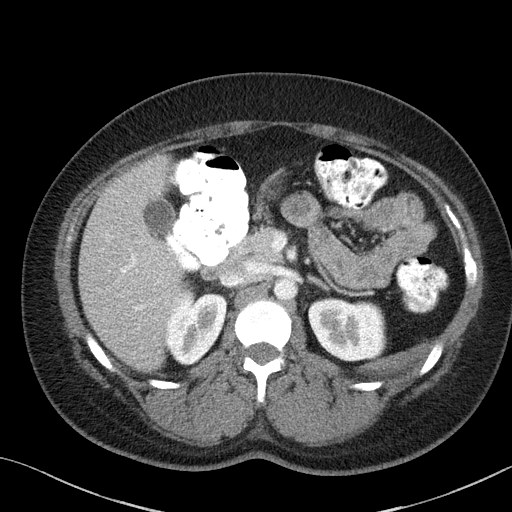
[im 31/55  soft-tissue]
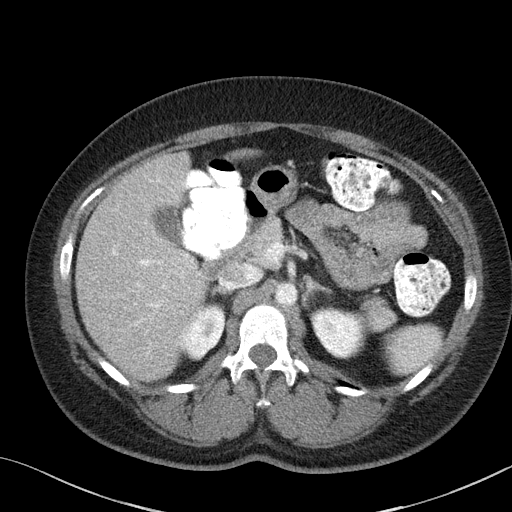
[im 37/55  soft-tissue]
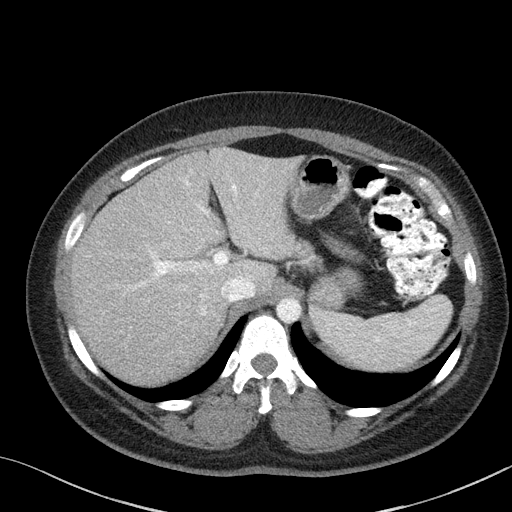
[im 37/55  bone]
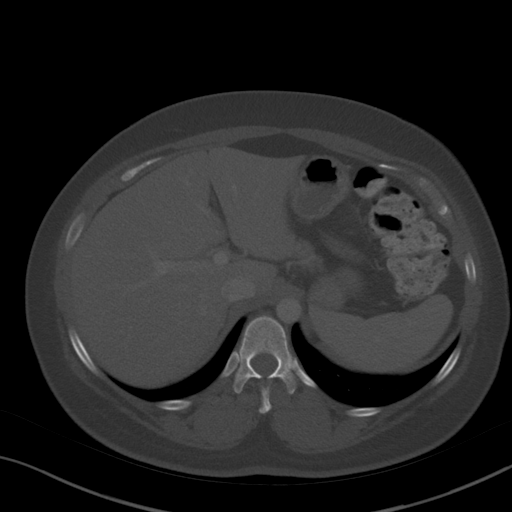
[im 39/55  soft-tissue]
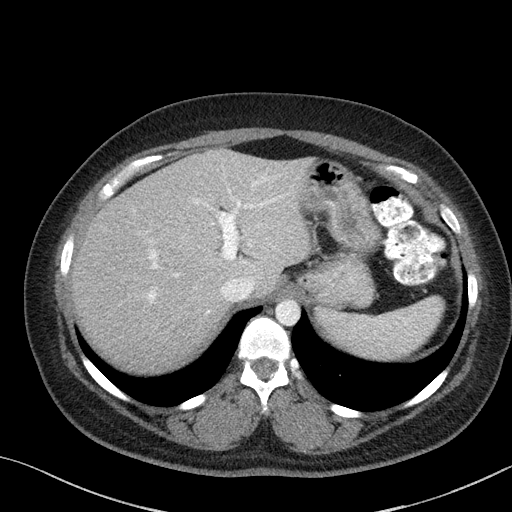
[im 44/55  soft-tissue]
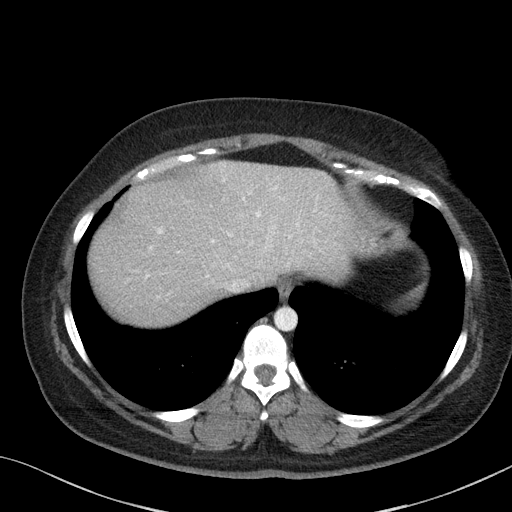
[im 47/55  soft-tissue]
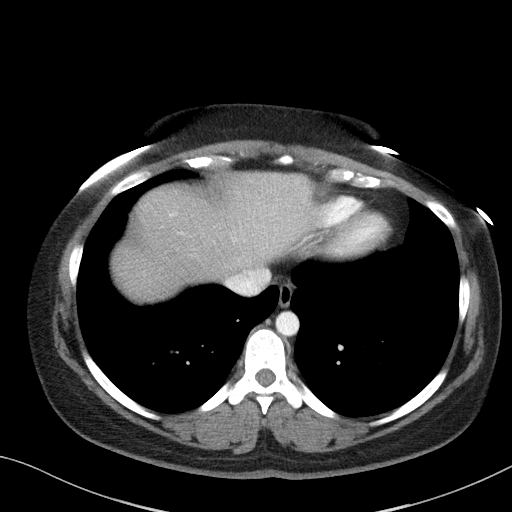
[im 52/55  soft-tissue]
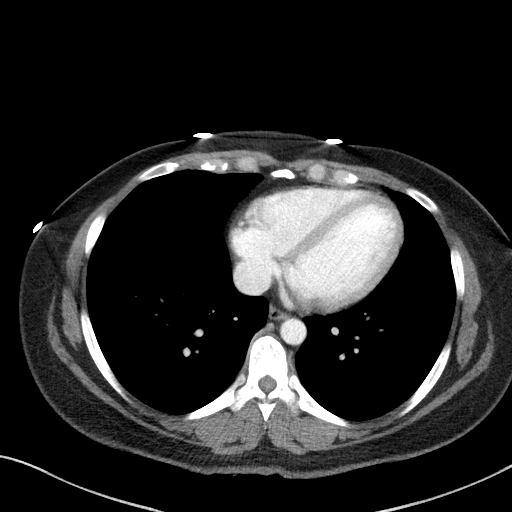

[Series 6: a/p w/ cor · coronal · 0.53mm/px · 3 of 138 slices shown]
[im 46/138  soft-tissue]
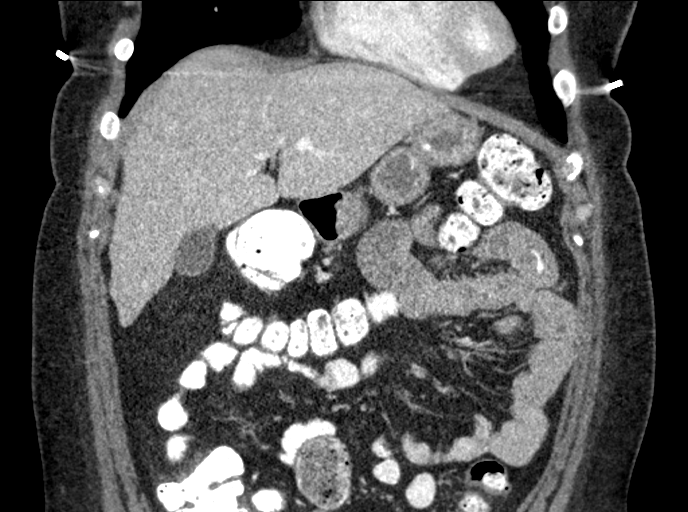
[im 61/138  soft-tissue]
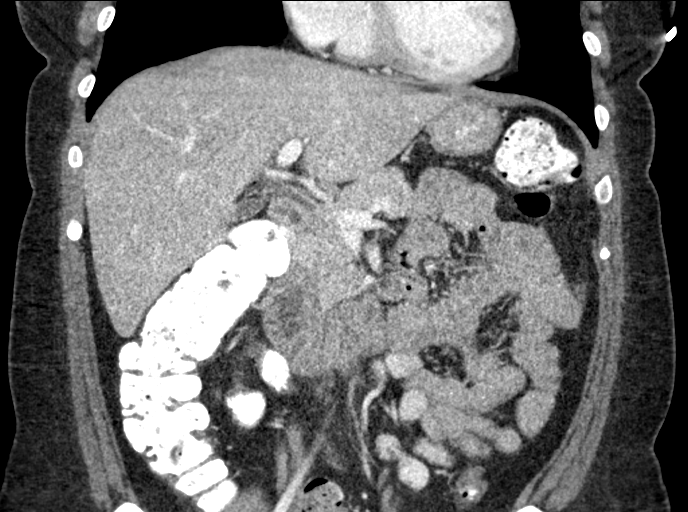
[im 77/138  soft-tissue]
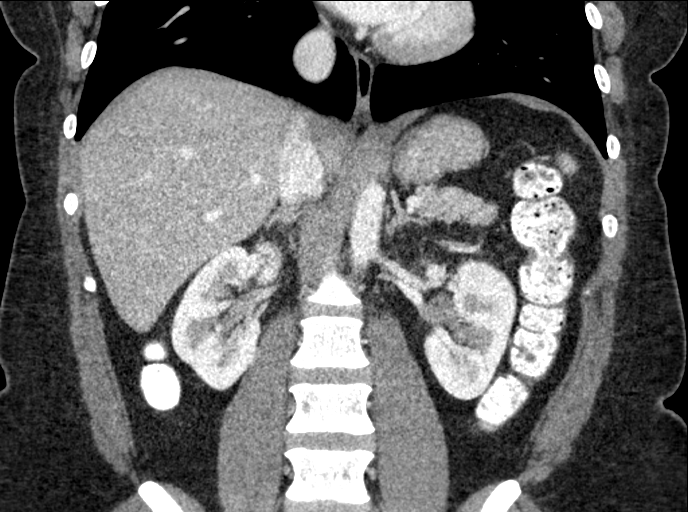

[16 of 46 positions shown; findings below may reference images not displayed]

FINDINGS: Lower chest: Unremarkable

Hepatobiliary: Unremarkable

Pancreas: Unremarkable

Spleen: Unremarkable

Adrenals/Urinary Tract: Unremarkable

Stomach/Bowel: The stomach is relatively nondistended during
imaging. The gastric body is fairly closely associated with the
hepatic flexure for example on image [DATE]. The duodenal bulb and
descending duodenum is fairly closely associated with the hepatic
flexure. It is possible that with gastric insufflation either of
these may be leading to an appearance suggesting an extrinsic
filling defect given the mild prominence in caliber of the colon. I
do not see any other lesion to cause extrinsic mass effect on the
stomach at this time.

Vascular/Lymphatic: Unremarkable

Other: No supplemental non-categorized findings.

Musculoskeletal: Unremarkable
IMPRESSION: 1. Mild prominence of the upper colon, which might simulate an
extrinsic mass lesion if the stomach were insufflated. I do not see
another candidate cause for the reported extrinsic filling defect.
